# Patient Record
Sex: Female | Born: 1980 | Hispanic: No | Marital: Married | Smoking: Never smoker
Health system: Southern US, Community
[De-identification: ages and names within clinical notes are randomized; demographics above are authoritative.]

## PROBLEM LIST (undated history)

## (undated) DIAGNOSIS — E669 Obesity, unspecified: Secondary | ICD-10-CM

## (undated) HISTORY — DX: Obesity, unspecified: E66.9

---

## 2009-01-06 ENCOUNTER — Ambulatory Visit (HOSPITAL_COMMUNITY): Admission: RE | Admit: 2009-01-06 | Discharge: 2009-01-06 | Payer: Self-pay | Admitting: Obstetrics and Gynecology

## 2009-03-04 ENCOUNTER — Ambulatory Visit (HOSPITAL_COMMUNITY): Admission: RE | Admit: 2009-03-04 | Discharge: 2009-03-04 | Payer: Self-pay | Admitting: Obstetrics & Gynecology

## 2009-09-11 HISTORY — PX: INSERTION OF NON VAGINAL CONTRACEPTIVE DEVICE: SHX6253

## 2010-10-03 ENCOUNTER — Encounter: Payer: Self-pay | Admitting: Obstetrics & Gynecology

## 2012-11-20 LAB — HM PAP SMEAR

## 2013-12-23 ENCOUNTER — Encounter: Payer: Self-pay | Admitting: *Deleted

## 2013-12-23 DIAGNOSIS — E669 Obesity, unspecified: Secondary | ICD-10-CM

## 2013-12-23 DIAGNOSIS — R5381 Other malaise: Secondary | ICD-10-CM

## 2013-12-23 DIAGNOSIS — R5383 Other fatigue: Principal | ICD-10-CM

## 2013-12-25 ENCOUNTER — Other Ambulatory Visit: Payer: Self-pay | Admitting: *Deleted

## 2013-12-25 DIAGNOSIS — Z0000 Encounter for general adult medical examination without abnormal findings: Secondary | ICD-10-CM

## 2013-12-26 ENCOUNTER — Other Ambulatory Visit: Payer: Self-pay

## 2013-12-26 LAB — COMPLETE METABOLIC PANEL WITH GFR
ALT: 17 U/L (ref 0–35)
AST: 18 U/L (ref 0–37)
Albumin: 4 g/dL (ref 3.5–5.2)
Alkaline Phosphatase: 62 U/L (ref 39–117)
BUN: 8 mg/dL (ref 6–23)
CO2: 26 mEq/L (ref 19–32)
Calcium: 9.5 mg/dL (ref 8.4–10.5)
Chloride: 104 mEq/L (ref 96–112)
Creat: 0.64 mg/dL (ref 0.50–1.10)
GFR, Est African American: >89 mL/min
GFR, Est Non African American: >89 mL/min
Glucose, Bld: 90 mg/dL (ref 70–99)
Potassium: 4.2 mEq/L (ref 3.5–5.3)
Sodium: 138 mEq/L (ref 135–145)
Total Bilirubin: 0.6 mg/dL (ref 0.2–1.2)
Total Protein: 7.3 g/dL (ref 6.0–8.3)

## 2013-12-26 LAB — CBC WITH DIFFERENTIAL/PLATELET
Basophils Absolute: 0 10*3/uL (ref 0.0–0.1)
Basophils Relative: 0 % (ref 0–1)
Eosinophils Absolute: 0.2 10*3/uL (ref 0.0–0.7)
Eosinophils Relative: 2 % (ref 0–5)
HCT: 40 % (ref 36.0–46.0)
Hemoglobin: 14.2 g/dL (ref 12.0–15.0)
Lymphocytes Relative: 30 % (ref 12–46)
Lymphs Abs: 2.3 10*3/uL (ref 0.7–4.0)
MCH: 29.2 pg (ref 26.0–34.0)
MCHC: 35.5 g/dL (ref 30.0–36.0)
MCV: 82.3 fL (ref 78.0–100.0)
Monocytes Absolute: 0.4 10*3/uL (ref 0.1–1.0)
Monocytes Relative: 5 % (ref 3–12)
Neutro Abs: 4.9 10*3/uL (ref 1.7–7.7)
Neutrophils Relative %: 63 % (ref 43–77)
Platelets: 290 10*3/uL (ref 150–400)
RBC: 4.86 MIL/uL (ref 3.87–5.11)
RDW: 13.1 % (ref 11.5–15.5)
WBC: 7.7 10*3/uL (ref 4.0–10.5)

## 2013-12-26 LAB — LIPID PANEL
Cholesterol: 145 mg/dL (ref 0–200)
HDL: 36 mg/dL — ABNORMAL LOW (ref >39)
LDL Cholesterol: 84 mg/dL (ref 0–99)
Total CHOL/HDL Ratio: 4 Ratio
Triglycerides: 124 mg/dL (ref <150)
VLDL: 25 mg/dL (ref 0–40)

## 2013-12-27 LAB — TSH: TSH: 2.593 u[IU]/mL (ref 0.350–4.500)

## 2014-01-01 ENCOUNTER — Encounter: Payer: Self-pay | Admitting: Family Medicine

## 2014-01-01 ENCOUNTER — Ambulatory Visit (INDEPENDENT_AMBULATORY_CARE_PROVIDER_SITE_OTHER): Payer: 59 | Admitting: Family Medicine

## 2014-01-01 VITALS — BP 102/71 | HR 77 | Resp 16 | Ht 59.75 in | Wt 171.0 lb

## 2014-01-01 DIAGNOSIS — M79672 Pain in left foot: Secondary | ICD-10-CM

## 2014-01-01 DIAGNOSIS — Z0000 Encounter for general adult medical examination without abnormal findings: Secondary | ICD-10-CM

## 2014-01-01 DIAGNOSIS — M79609 Pain in unspecified limb: Secondary | ICD-10-CM

## 2014-01-01 DIAGNOSIS — M79671 Pain in right foot: Secondary | ICD-10-CM

## 2014-01-01 DIAGNOSIS — M25539 Pain in unspecified wrist: Secondary | ICD-10-CM

## 2014-01-01 LAB — POCT URINALYSIS DIPSTICK
Bilirubin, UA: NEGATIVE
Blood, UA: NEGATIVE
Glucose, UA: NEGATIVE
Ketones, UA: NEGATIVE
Leukocytes, UA: NEGATIVE
Nitrite, UA: NEGATIVE
Protein, UA: NEGATIVE
Spec Grav, UA: 1.01
Urobilinogen, UA: NEGATIVE
pH, UA: 6.5

## 2014-01-01 NOTE — Progress Notes (Signed)
Subjective:    Patient ID: Kathleen Fox, female    DOB: April 17, 1981, 33 y.o.   MRN: [id number]  HPI  Kathleen Fox is here today with her husband for her CPE. She has done well since her last office visit and has no medical compliants.   Review of Systems  Constitutional: Negative for activity change, appetite change, fatigue and unexpected weight change.  HENT: Negative for congestion, dental problem, ear pain, hearing loss, trouble swallowing and voice change.   Eyes: Negative for pain, redness and visual disturbance.  Respiratory: Negative for cough and shortness of breath.   Cardiovascular: Negative for chest pain, palpitations and leg swelling.  Gastrointestinal: Negative for nausea, vomiting, abdominal pain, diarrhea, constipation and blood in stool.  Endocrine: Negative for cold intolerance, heat intolerance, polydipsia, polyphagia and polyuria.  Genitourinary: Negative for dysuria, urgency, frequency, hematuria, vaginal discharge and pelvic pain.  Musculoskeletal: Negative for arthralgias, back pain, joint swelling, myalgias and neck pain.       Pain in feet and wrist  Skin: Negative for rash.  Neurological: Negative for dizziness, weakness and headaches.  Hematological: Negative for adenopathy. Does not bruise/bleed easily.  Psychiatric/Behavioral: Negative for sleep disturbance, dysphoric mood and decreased concentration. The patient is not nervous/anxious.      Past Medical History  Diagnosis Date  . Obesity      Past Surgical History  Procedure Laterality Date  . Insertion of non vaginal contraceptive device  2011     History   Social History Narrative   Marital Status:  Married Patent examiner(Mohammad)    Children:  Daughters Arley Phenix(Aisha 08/1999 &  Kiran 05/2009); Son Camila Li(Osman)  06/2006   Pets: None    Living Situation: Lives with husband and children   Country of Origin:  [place]    Occupation: Homemaker   Education: Engineer, agriculturalHigh School Graduate    Tobacco Use/Exposure:  Never   Alcohol  Use:  Occasional   Drug Use:  None   Diet:  Regular   Exercise:  None   Hobbies: Family     Family History  Problem Relation Age of Onset  . Hypertension Mother   . Hypertension Sister      No Known Allergies   Immunization History  Administered Date(s) Administered  . Tdap 05/12/2009       Objective:   Physical Exam  Nursing note and vitals reviewed. Constitutional: She is oriented to person, place, and time. She appears well-developed and well-nourished. No distress.  HENT:  Head: Normocephalic and atraumatic.  Right Ear: External ear normal.  Left Ear: External ear normal.  Nose: Nose normal.  Mouth/Throat: Oropharynx is clear and moist.  Eyes: Conjunctivae and EOM are normal. Pupils are equal, round, and reactive to light. Right eye exhibits no discharge. Left eye exhibits no discharge. No scleral icterus.  Neck: Normal range of motion. Neck supple. No thyromegaly present.  Cardiovascular: Normal rate, regular rhythm, normal heart sounds and intact distal pulses.  Exam reveals no gallop and no friction rub.   No murmur heard. Pulmonary/Chest: Effort normal and breath sounds normal. Right breast exhibits no inverted nipple, no mass, no nipple discharge, no skin change and no tenderness. Left breast exhibits no inverted nipple, no mass, no nipple discharge, no skin change and no tenderness. Breasts are symmetrical.  Abdominal: Soft. Bowel sounds are normal. She exhibits no distension and no mass. There is no tenderness. Hernia confirmed negative in the right inguinal area and confirmed negative in the left inguinal area.  Genitourinary: Vagina normal  and uterus normal. Pelvic exam was performed with patient supine. There is no rash, tenderness or lesion on the right labia. There is no rash, tenderness or lesion on the left labia. No vaginal discharge found.  Musculoskeletal: Normal range of motion. She exhibits no edema and no tenderness.  Lymphadenopathy:    She has no  cervical adenopathy.       Right: No inguinal adenopathy present.       Left: No inguinal adenopathy present.  Neurological: She is alert and oriented to person, place, and time. She has normal reflexes.  Skin: Skin is warm and dry. No rash noted.  Psychiatric: She has a normal mood and affect. Her behavior is normal. Judgment and thought content normal.       Assessment & Plan:    Kathleen Fox was seen today for annual exam.  Diagnoses and associated orders for this visit:  Routine general medical examination at a health care facility - POCT urinalysis dipstick  Pain in both feet Comments: She is to take Ibuprofen/Tylenol, lose weight and F/U with Dr. Delford FieldWright.    Pain in wrist, unspecified laterality Comments: She is to try a wrist splint.

## 2014-01-01 NOTE — Patient Instructions (Signed)
1)  Low Energy/Fatigue -  Try the Alive vitamin and add Citracal Slow Release 1200 (Calcium + Vitamin D)   2)  Feet Pain - Ibuprofen 600 mg plus Tylenol 1000 mg up to 3 times per day; Follow up with Dr. Delford FieldWright (Podiatrist); Weight Loss  3)  Wrist Pain/Numbess - Try a wrist splint at night [facility])   4)  Vitamin D - Sunlight plus 1000 IU in the Citracal; We can check a level in September.     Carpal Tunnel Syndrome The carpal tunnel is a narrow area located on the palm side of your wrist. The tunnel is formed by the wrist bones and ligaments. Nerves, blood vessels, and tendons pass through the carpal tunnel. Repeated wrist motion or certain diseases may cause swelling within the tunnel. This swelling pinches the main nerve in the wrist (median nerve) and causes the painful hand and arm condition called carpal tunnel syndrome. CAUSES   Repeated wrist motions.  Wrist injuries.  Certain diseases like arthritis, diabetes, alcoholism, hyperthyroidism, and kidney failure.  Obesity.  Pregnancy. SYMPTOMS   A "pins and needles" feeling in your fingers or hand.  Tingling or numbness in your fingers or hand.  An aching feeling in your entire arm.  Wrist pain that goes up your arm to your shoulder.  Pain that goes down into your palm or fingers.  A weak feeling in your hands. DIAGNOSIS  Your caregiver will take your history and perform a physical exam. An electromyography test may be needed. This test measures electrical signals sent out by the muscles. The electrical signals are usually slowed by carpal tunnel syndrome. You may also need X-rays. TREATMENT  Carpal tunnel syndrome may clear up by itself. Your caregiver may recommend a wrist splint or medicine such as a nonsteroidal anti-inflammatory medicine. Cortisone injections may help. Sometimes, surgery may be needed to free the pinched nerve.  HOME CARE INSTRUCTIONS   Take all medicine as directed by your caregiver. Only take  over-the-counter or prescription medicines for pain, discomfort, or fever as directed by your caregiver.  If you were given a splint to keep your wrist from bending, wear it as directed. It is important to wear the splint at night. Wear the splint for as long as you have pain or numbness in your hand, arm, or wrist. This may take 1 to 2 months.  Rest your wrist from any activity that may be causing your pain. If your symptoms are work-related, you may need to talk to your employer about changing to a job that does not require using your wrist.  Put ice on your wrist after long periods of wrist activity.  Put ice in a plastic bag.  Place a towel between your skin and the bag.  Leave the ice on for 15-20 minutes, 03-04 times a day.  Keep all follow-up visits as directed by your caregiver. This includes any orthopedic referrals, physical therapy, and rehabilitation. Any delay in getting necessary care could result in a delay or failure of your condition to heal. SEEK IMMEDIATE MEDICAL CARE IF:   You have new, unexplained symptoms.  Your symptoms get worse and are not helped or controlled with medicines. MAKE SURE YOU:   Understand these instructions.  Will watch your condition.  Will get help right away if you are not doing well or get worse. Document Released: 08/25/2000 Document Revised: 11/20/2011 Document Reviewed: 07/14/2011 [facility] Patient Information 2014 [place], [place].

## 2014-03-21 DIAGNOSIS — M79671 Pain in right foot: Secondary | ICD-10-CM | POA: Insufficient documentation

## 2014-03-21 DIAGNOSIS — M79672 Pain in left foot: Secondary | ICD-10-CM | POA: Insufficient documentation

## 2014-03-21 DIAGNOSIS — Z0000 Encounter for general adult medical examination without abnormal findings: Secondary | ICD-10-CM | POA: Insufficient documentation

## 2014-03-21 DIAGNOSIS — M25539 Pain in unspecified wrist: Secondary | ICD-10-CM | POA: Insufficient documentation
